# Patient Record
Sex: Male | Born: 1965 | Race: Black or African American | Hispanic: No | Marital: Married | Smoking: Former smoker
Health system: Southern US, Community
[De-identification: ages and names within clinical notes are randomized; demographics above are authoritative.]

## PROBLEM LIST (undated history)

## (undated) DIAGNOSIS — I10 Essential (primary) hypertension: Secondary | ICD-10-CM

## (undated) HISTORY — PX: ABDOMINAL SURGERY: SHX537

---

## 2015-02-07 ENCOUNTER — Encounter (HOSPITAL_COMMUNITY): Payer: Self-pay | Admitting: Cardiology

## 2015-02-07 ENCOUNTER — Emergency Department (HOSPITAL_COMMUNITY)
Admission: EM | Admit: 2015-02-07 | Discharge: 2015-02-07 | Disposition: A | Discharge status: Home / Self Care | Payer: Medicaid - Out of State | Attending: Emergency Medicine | Admitting: Emergency Medicine

## 2015-02-07 DIAGNOSIS — Z87828 Personal history of other (healed) physical injury and trauma: Secondary | ICD-10-CM | POA: Diagnosis not present

## 2015-02-07 DIAGNOSIS — M25512 Pain in left shoulder: Secondary | ICD-10-CM | POA: Diagnosis not present

## 2015-02-07 DIAGNOSIS — I10 Essential (primary) hypertension: Secondary | ICD-10-CM | POA: Diagnosis not present

## 2015-02-07 DIAGNOSIS — G8929 Other chronic pain: Secondary | ICD-10-CM | POA: Diagnosis not present

## 2015-02-07 DIAGNOSIS — Z87891 Personal history of nicotine dependence: Secondary | ICD-10-CM | POA: Insufficient documentation

## 2015-02-07 HISTORY — DX: Essential (primary) hypertension: I10

## 2015-02-07 MED ORDER — MELOXICAM 7.5 MG PO TABS: 7.5000 mg | ORAL_TABLET | Freq: Every day | ORAL | Status: AC

## 2015-02-07 NOTE — Discharge Instructions (Signed)
Read the information below.  Use the prescribed medication as directed.  Please discuss all new medications with your pharmacist.  You may return to the Emergency Department at any time for worsening condition or any new symptoms that concern you.    If you develop uncontrolled pain, weakness or numbness of the extremity, severe discoloration of the skin, or you are unable to move your shoulder, return to the ER for a recheck.      Shoulder Pain The shoulder is the joint that connects your arms to your body. The bones that form the shoulder joint include the upper arm bone (humerus), the shoulder blade (scapula), and the collarbone (clavicle). The top of the humerus is shaped like a ball and fits into a rather flat socket on the scapula (glenoid cavity). A combination of muscles and strong, fibrous tissues that connect muscles to bones (tendons) support your shoulder joint and hold the ball in the socket. Small, fluid-filled sacs (bursae) are located in different areas of the joint. They act as cushions between the bones and the overlying soft tissues and help reduce friction between the gliding tendons and the bone as you move your arm. Your shoulder joint allows a wide range of motion in your arm. This range of motion allows you to do things like scratch your back or throw a ball. However, this range of motion also makes your shoulder more prone to pain from overuse and injury. Causes of shoulder pain can originate from both injury and overuse and usually can be grouped in the following four categories:  Redness, swelling, and pain (inflammation) of the tendon (tendinitis) or the bursae (bursitis).  Instability, such as a dislocation of the joint.  Inflammation of the joint (arthritis).  Broken bone (fracture). HOME CARE INSTRUCTIONS   Apply ice to the sore area.  Put ice in a plastic bag.  Place a towel between your skin and the bag.  Leave the ice on for 15-20 minutes, 3-4 times per day for  the first 2 days, or as directed by your health care provider.  Stop using cold packs if they do not help with the pain.  If you have a shoulder sling or immobilizer, wear it as long as your caregiver instructs. Only remove it to shower or bathe. Move your arm as little as possible, but keep your hand moving to prevent swelling.  Squeeze a soft ball or foam pad as much as possible to help prevent swelling.  Only take over-the-counter or prescription medicines for pain, discomfort, or fever as directed by your caregiver. SEEK MEDICAL CARE IF:   Your shoulder pain increases, or new pain develops in your arm, hand, or fingers.  Your hand or fingers become cold and numb.  Your pain is not relieved with medicines. SEEK IMMEDIATE MEDICAL CARE IF:   Your arm, hand, or fingers are numb or tingling.  Your arm, hand, or fingers are significantly swollen or turn white or blue. MAKE SURE YOU:   Understand these instructions.  Will watch your condition.  Will get help right away if you are not doing well or get worse. Document Released: 03/18/2005 Document Revised: 10/23/2013 Document Reviewed: 05/23/2011 [facility] Patient Information 2015 [place], [place]. This information is not intended to replace advice given to you by your health care provider. Make sure you discuss any questions you have with your health care provider.  Shoulder Range of Motion Exercises The shoulder is the most flexible joint in the human body. Because of this  it is also the most unstable joint in the body. All ages can develop shoulder problems. Early treatment of problems is necessary for a good outcome. People react to shoulder pain by decreasing the movement of the joint. After a brief period of time, the shoulder can become "frozen". This is an almost complete loss of the ability to move the damaged shoulder. Following injuries your caregivers can give you instructions on exercises to keep your range of motion (ability  to move your shoulder freely), or regain it if it has been lost.  EXERCISES EXERCISES TO MAINTAIN THE MOBILITY OF YOUR SHOULDER: Codman's Exercise or Pendulum Exercise  This exercise may be performed in a prone (face-down) lying position or standing while leaning on a chair with the opposite arm. Its purpose is to relax the muscles in your shoulder and slowly but surely increase the range of motion and to relieve pain.  Lie on your stomach close to the side edge of the bed. Let your weak arm hang over the edge of the bed. Relax your shoulder, arm and hand. Let your shoulder blade relax and drop down.  Slowly and gently swing your arm forward and back. Do not use your neck muscles; relax them. It might be easier to have someone else gently start swinging your arm.  As pain decreases, increase your swing. To start, arm swing should begin at 15 degree angles. In time and as pain lessens, move to 30-45 degree angles. Start with swinging for about 15 seconds, and work towards swinging for 3 to 5 minutes.  This exercise may also be performed in a standing/bent over position.  Stand and hold onto a sturdy chair with your good arm. Bend forward at the waist and bend your knees slightly to help protect your back. Relax your weak arm, let it hang limp. Relax your shoulder blade and let it drop.  Keep your shoulder relaxed and use body motion to swing your arm in small circles.  Stand up tall and relax.  Repeat motion and change direction of circles.  Start with swinging for about 30 seconds, and work towards swinging for 3 to 5 minutes. STRETCHING EXERCISES:  Lift your arm out in front of you with the elbow bent at 90 degrees. Using your other arm gently pull the elbow forward and across your body.  Bend one arm behind you with the palm facing outward. Using the other arm, hold a towel or rope and reach this arm up above your head, then bend it at the elbow to move your wrist to behind your neck.  Grab the free end of the towel with the hand behind your back. Gently pull the towel up with the hand behind your neck, gradually increasing the pull on the hand behind the small of your back. Then, gradually pull down with the hand behind the small of your back. This will pull the hand and arm behind your neck further. Both shoulders will have an increased range of motion with repetition of this exercise. STRENGTHENING EXERCISES:  Standing with your arm at your side and straight out from your shoulder with the elbow bent at 90 degrees, hold onto a small weight and slowly raise your hand so it points straight up in the air. Repeat this five times to begin with, and gradually increase to ten times. Do this four times per day. As you grow stronger you can gradually increase the weight.  Repeat the above exercise, only this time using an elastic  band. Start with your hand up in the air and pull down until your hand is by your side. As you grow stronger, gradually increase the amount you pull by increasing the number or size of the elastic bands. Use the same amount of repetitions.  Standing with your hand at your side and holding onto a weight, gradually lift the hand in front of you until it is over your head. Do the same also with the hand remaining at your side and lift the hand away from your body until it is again over your head. Repeat this five times to begin with, and gradually increase to ten times. Do this four times per day. As you grow stronger you can gradually increase the weight. Document Released: 03/07/2003 Document Revised: 06/13/2013 Document Reviewed: 06/08/2005 [facility] Patient Information 2015 [place], [place]. This information is not intended to replace advice given to you by your health care provider. Make sure you discuss any questions you have with your health care provider.    Emergency Department Resource Guide 1) Find a Doctor and Pay Out of Pocket Although you won't have to  find out who is covered by your insurance plan, it is a good idea to ask around and get recommendations. You will then need to call the office and see if the doctor you have chosen will accept you as a new patient and what types of options they offer for patients who are self-pay. Some doctors offer discounts or will set up payment plans for their patients who do not have insurance, but you will need to ask so you aren't surprised when you get to your appointment.  2) Contact Your Local Health Department Not all health departments have doctors that can see patients for sick visits, but many do, so it is worth a call to see if yours does. If you don't know where your local health department is, you can check in your phone book. The CDC also has a tool to help you locate your state's health department, and many state websites also have listings of all of their local health departments.  3) Find a Walk-in Clinic If your illness is not likely to be very severe or complicated, you may want to try a walk in clinic. These are popping up all over the country in pharmacies, drugstores, and shopping centers. They're usually staffed by nurse practitioners or physician assistants that have been trained to treat common illnesses and complaints. They're usually fairly quick and inexpensive. However, if you have serious medical issues or chronic medical problems, these are probably not your best option.  No Primary Care Doctor: - Call Health Connect at  [phone] - they can help you locate a primary care doctor that  accepts your insurance, provides certain services, etc. - Physician Referral Service- [phone]  Chronic Pain Problems: Organization         Address  Phone   Notes  [facility]  [phone] Patients need to be referred by their primary care doctor.   Medication Assistance: Organization         Address  Phone   Notes  [facility] [address] [place], [place] [address] [phone] --Must be a resident of [facility] -- Must have NO insurance coverage whatsoever (no Medicaid/ Medicare, etc.) -- The pt. MUST have a primary care doctor that directs their care regularly and follows them in the community   [facility]  [phone]  [employer]  [phone]    Agencies that provide inexpensive medical care: Organization         Address  Phone   Notes  [facility]  [phone]   [facility]    [phone]   [facility] [address] [place], [place] [address] [phone]   [facility] [address], [place] [phone]   [facility]anned Parenthood    [phone]   [facility]    [phone]   [facility]  [address], [place] Phone:  [phone], Fax:  [phone] Hours of Operation:  9 am - 6 pm, M-F.  Also accepts Medicaid/Medicare and self-pay.  [facility]  [address], [place] Phone: [phone], Fax: [phone]. Hours of Operation:  8:30 am - 5:30 pm, M-F.  Also accepts Medicaid and self-pay.  [facility] [place] [address] [place] Phone: [phone]   [facility] [address] Natasha Bence [place], [place] [phone], Ext. [phone] Mondays & Thursdays: 7-9 AM.  First 15 patients are seen on a first come, first serve basis.    Medicaid-accepting [facility] Providers:  Organization         Address  Phone   Notes  [facility] [address],  [phone] Also accepts self-pay patients.  [facility] [address] Laurell Josephs [place], [place]  [phone]   [facility] [address], [place] [phone]   [facility] [address] [place] [phone]   Renaye Rakers [address], [place]   [phone] Only accepts [place] Access [place] patients after they have their name applied to their card.   Self-Pay (no insurance) in [facility]:  Organization         Address  Phone   Notes  Sickle Cell Patients, [facility] [address] [place], [place] [phone]   [facility] [address] [place], [place] [phone]   [facility] [place]  [address], [place] [phone]   [facility]/Dr. Osei-Bonsu  [address] [place] or [address], [place] [phone] Phone number for both [place] and [place] locations is the same.  Urgent Medical and [facility] [address], [place] [phone]   [facility] [address] [place] or [address] [phone]   [facility] [address] [place] [phone], phone; [phone], fax Sees patients 1st and 3rd Saturday of every month.  Must not qualify for public or private insurance (i.e. Medicaid, Medicare, [place] Health Choice, Veterans' Benefits)  Household income should be no more than 200% of the poverty level The clinic cannot treat you if you are pregnant or think you are pregnant  Sexually transmitted diseases are not treated at the clinic.    Dental Care: Organization         Address  Phone  Notes  [facility] Department of [facility] [address] [place], [place] [phone] Accepts children up to age 54 who are enrolled in [place] or [place] Health Choice; pregnant women with a Medicaid card; and  children who have applied for Medicaid or [place] Health Choice, but were declined, whose parents can pay a reduced fee at time of service.  [facility]  [address], [place] [phone] Accepts children up to age  20 who are enrolled in [place] or [place] Health Choice; pregnant women with a Medicaid card; and children who have applied for Medicaid or [place] Health Choice, but were declined, whose parents can pay a reduced fee at time of service.  [facility]  [address] [place], [place] [phone] Patients are seen by appointment only. Walk-ins are not accepted. [facility] will see patients 60 years of age and older. Monday - Tuesday (8am-5pm) Most Wednesdays (8:30-5pm) $30 per visit, cash only  [facility] Adult [facility] Access [facility]  [address], [facility] [phone] Patients are seen by appointment only. Walk-ins are not accepted. [facility] will see patients 28 years of age and older. One Wednesday Evening (Monthly: Volunteer Based).  $30 per visit, cash only  [employer]  [phone] for adults; Children under age 5, call Graduate Pediatric Dentistry at [phone]. Children aged 21-14, please call [phone] to request a pediatric application.  Dental services are provided in all areas of dental care including fillings, crowns and bridges, complete and partial dentures, implants, gum treatment, root canals, and extractions. Preventive care is also provided. Treatment is provided to both adults and children. Patients are selected via a lottery and there is often a waiting list.   [facility] [address] [place]  [phone] [email or web address]   [facility] [address] [place], [place] [phone], Ext. [phone] Second and Fourth Thursday of each month, opens at 6:30 AM; Clinic ends at 9 AM.  Patients are seen on a first-come first-served basis, and a limited number are seen during each clinic.   [facility]  [address] Ether Griffins [place], [place] [phone]   Eligibility Requirements You must have lived in [place], [place], or [place] counties for at least the last three  months.   You cannot be eligible for state or federal sponsored [place], including [employer], [place], or [employer].   You generally cannot be eligible for healthcare insurance through your employer.    How to apply: Eligibility screenings are held every Tuesday and Wednesday afternoon from 1:00 pm until 4:00 pm. You do not need an appointment for the interview!  [facility] [address] [place], [place] [phone]   [facility]  [phone]   [facility]  [phone]   [facility] Health Department  [phone]    Behavioral Health Resources in the Community: Intensive Outpatient Programs Organization         Address  Phone  Notes  [facility] [address], [place], [place] [phone]   [facility] [address] [place], [place] [phone]   [facility] [address] [place], [place]  [phone]   [facility] Mental Health [address]  [place], [place] [phone] or [phone]   Substance Abuse Resources Organization         Address  Phone  Notes  [facility]  [phone]   [facility]  [phone]   [facility] [place]  [phone]   [facility]  [phone]   Residential &  Outpatient Substance Abuse Program  [phone]   Psychological Services Organization         Address  Phone  Notes  [facility]  [phone]   [facility]  [phone]   [facility] [phone] [address] [place] [phone] or [phone]    Mobile Crisis Teams Organization         Address  Phone  Notes  [facility], Mobile Crisis Care Unit  [phone]   [facility]  [address] [place], [place] [phone]   Doristine Locks [address] [place] [phone]    Self-Help/Support  Groups Organization         Address  Phone             Notes  [facility] [place] - variety of support groups  336- I7437963 Call for more information  Narcotics Anonymous (NA), [facility] [address], [employer] [place]  2 meetings at this location   Statistician         Address  Phone  Notes  [facility] [address],    [place]  [phone]   [facility]  [address], [place] [address], [place], [place] [phone]   [facility] [address] [place], [place] [phone] Admissions: 8am-3pm M-F  [facility] [address],    [place], [place] [phone]   [facility] [address] [place], [place], [place] [phone]   [facility] [address],  [place], [place] [phone]   [facility] - Intensive Outpatient [address] Laurell Josephs [address], [place], [place] [phone]   [facility] (Addiction [facility] [address]  [place], [place] [phone] or [phone]   [facility] ([facility]) [address] [place], [place] [phone] Accepts Medicaid  [facility] [address]  [place] [phone] Substance Abuse/Addiction Treatment   [facility] Organization         Address  Phone  Notes  [facility]  [phone]   Angie Fava, PhD [address] Ervin Knack [place], [place]   [phone] or [phone]   [facility]   [address] [place], [place] [phone]   [facility] [address] [place], [place] [phone] Insurance/Medicaid/sponsorship through [facility] and Families [address]                                    [place], [place] [phone] Therapy/tele-psych/case  [facility] [address], [place] [phone]    Dr. Lolly Mustache  [phone]   [facility] [place]  [employer] [facility] Dept. 1) [address] [place] 2) [address] [place] 3)  [address], [place] [phone]  [phone]   [facility] Child Abuse Hotline (408)722-6225 or 6234240069 (After Hours)

## 2015-02-07 NOTE — ED Provider Notes (Signed)
CSN: [id number]     Arrival date & [id number]  1154 History  This chart was scribed for non-physician practitioner Trixie Dredge, PA-C, working with Raeford Razor, MD by Littie Deeds, ED Scribe. This patient was seen in room TR09C/TR09C and the patient's care was started at 12:36 PM.      Chief Complaint  Patient presents with  . Shoulder Pain   The history is provided by the patient. No language interpreter was used.   HPI Comments: Brent Green is a 49 y.o. male who presents to the Emergency Department complaining of chronic, stabbing left shoulder pain that worsened recently. The pain is worse with movement. It has been ongoing for years and is unchanged.  No new injury.  Patient denies arm numbness or weakness, back pain, chest pain, and neck pain. He also denies new injury. 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 He was told years ago that he had cracked something in his left shoulder which welded back together. Patient does not have a PCP here as he recently moved to the area and wants a referral.   Pt previously took percocet for pain.   Past Medical History  Diagnosis Date  . Hypertension    History reviewed. No pertinent past surgical history. History reviewed. No pertinent family history. Social History  Substance Use Topics  . Smoking status: Former Games developer  . Smokeless tobacco: None  . Alcohol Use: No    Review of Systems  Constitutional: Negative for fever and chills.  Respiratory: Negative for shortness of breath.   Cardiovascular: Negative for chest pain.  Musculoskeletal: Positive for arthralgias. Negative for back pain and neck pain.  Skin: Negative for color change.  Allergic/Immunologic: Negative for immunocompromised state.  Neurological: Negative for weakness and numbness.  Psychiatric/Behavioral: Negative for self-injury.      Allergies  Review of patient's allergies indicates no known allergies.  Home Medications   Prior to Admission medications    Not on File   BP 126/74 mmHg  Pulse 79  Temp(Src) 98.1 F (36.7 C) (Oral)  Resp 16  SpO2 99% Physical Exam  Constitutional: He appears well-developed and well-nourished. No distress.  HENT:  Head: Normocephalic and atraumatic.  Neck: Neck supple.  Pulmonary/Chest: Effort normal.  Musculoskeletal:  Upper extremities:  Strength 5/5, sensation intact, distal pulses intact. Left shoulder with tenderness anteriorly. Full active ROM with pain attempting to move arm posteriorly.  No erythema, edema, warmth, discharge.   Neurological: He is alert.  Skin: He is not diaphoretic.  Nursing note and vitals reviewed.   ED Course  Procedures  DIAGNOSTIC STUDIES: Oxygen Saturation is 99% on room air, normal by my interpretation.    COORDINATION OF CARE: 12:41 PM-Discussed treatment plan which includes medications and orthopedic referral with patient/guardian at bedside and patient/guardian agreed to plan.    Labs Review Labs Reviewed - No data to display  Imaging Review No results found. I have personally reviewed and evaluated these images and lab results as part of my medical decision-making.   EKG Interpretation None      MDM   Final diagnoses:  Chronic left shoulder pain    Afebrile, nontoxic patient with chronic left shoulder pain x many years, unchanged.  No new injury.  Neurovascularly intact.  Of note, pt and has spouse came to the ED together, both complaining of chronic pain and both requesting percocet.   D/C home with mobic, orthopedic referral.  Discussed result, findings, treatment, and follow up  with  patient.  Pt given return precautions.  Pt verbalizes understanding and agrees with plan.      I personally performed the services described in this documentation, which was scribed in my presence. The recorded information has been reviewed and is accurate.    Trixie Dredge, PA-C 02/07/15 1423  Raeford Razor, MD 02/08/15 [phone]

## 2015-02-07 NOTE — ED Notes (Signed)
Pt reports that he has an old injury of his left shoulder. Reports the pain has recently increased and he is out of medication.

## 2015-03-06 ENCOUNTER — Encounter (HOSPITAL_COMMUNITY): Payer: Self-pay | Admitting: Emergency Medicine

## 2015-03-06 ENCOUNTER — Emergency Department (HOSPITAL_COMMUNITY)
Admission: EM | Admit: 2015-03-06 | Discharge: 2015-03-06 | Disposition: A | Discharge status: Home / Self Care | Payer: Medicaid - Out of State | Attending: Emergency Medicine | Admitting: Emergency Medicine

## 2015-03-06 ENCOUNTER — Emergency Department (HOSPITAL_COMMUNITY): Payer: Medicaid - Out of State

## 2015-03-06 DIAGNOSIS — Z87891 Personal history of nicotine dependence: Secondary | ICD-10-CM | POA: Insufficient documentation

## 2015-03-06 DIAGNOSIS — I10 Essential (primary) hypertension: Secondary | ICD-10-CM | POA: Diagnosis not present

## 2015-03-06 DIAGNOSIS — R945 Abnormal results of liver function studies: Secondary | ICD-10-CM

## 2015-03-06 DIAGNOSIS — Z791 Long term (current) use of non-steroidal anti-inflammatories (NSAID): Secondary | ICD-10-CM | POA: Diagnosis not present

## 2015-03-06 DIAGNOSIS — R509 Fever, unspecified: Secondary | ICD-10-CM | POA: Diagnosis present

## 2015-03-06 DIAGNOSIS — J159 Unspecified bacterial pneumonia: Secondary | ICD-10-CM | POA: Diagnosis not present

## 2015-03-06 DIAGNOSIS — R7989 Other specified abnormal findings of blood chemistry: Secondary | ICD-10-CM | POA: Insufficient documentation

## 2015-03-06 DIAGNOSIS — R51 Headache: Secondary | ICD-10-CM | POA: Insufficient documentation

## 2015-03-06 DIAGNOSIS — J189 Pneumonia, unspecified organism: Secondary | ICD-10-CM

## 2015-03-06 LAB — URINALYSIS, ROUTINE W REFLEX MICROSCOPIC
Glucose, UA: NEGATIVE mg/dL
Hgb urine dipstick: NEGATIVE
Ketones, ur: NEGATIVE mg/dL
LEUKOCYTES UA: NEGATIVE
NITRITE: NEGATIVE
PH: 7 (ref 5.0–8.0)
Protein, ur: NEGATIVE mg/dL
SPECIFIC GRAVITY, URINE: 1.015 (ref 1.005–1.030)
UROBILINOGEN UA: 4 mg/dL — AB (ref 0.0–1.0)

## 2015-03-06 LAB — COMPREHENSIVE METABOLIC PANEL
ALBUMIN: 3.5 g/dL (ref 3.5–5.0)
ALK PHOS: 134 U/L — AB (ref 38–126)
ALT: 100 U/L — AB (ref 17–63)
AST: 59 U/L — ABNORMAL HIGH (ref 15–41)
Anion gap: 8 (ref 5–15)
BILIRUBIN TOTAL: 3.3 mg/dL — AB (ref 0.3–1.2)
BUN: 6 mg/dL (ref 6–20)
CALCIUM: 9.2 mg/dL (ref 8.9–10.3)
CO2: 25 mmol/L (ref 22–32)
CREATININE: 0.93 mg/dL (ref 0.61–1.24)
Chloride: 100 mmol/L — ABNORMAL LOW (ref 101–111)
GFR calc Af Amer: >60 mL/min (ref >60)
GFR calc non Af Amer: >60 mL/min (ref >60)
GLUCOSE: 102 mg/dL — AB (ref 65–99)
Potassium: 3.8 mmol/L (ref 3.5–5.1)
SODIUM: 133 mmol/L — AB (ref 135–145)
TOTAL PROTEIN: 8.2 g/dL — AB (ref 6.5–8.1)

## 2015-03-06 LAB — CBC WITH DIFFERENTIAL/PLATELET
BASOS ABS: 0 10*3/uL (ref 0.0–0.1)
Basophils Relative: 0 %
EOS PCT: 0 %
Eosinophils Absolute: 0.1 10*3/uL (ref 0.0–0.7)
HCT: 44 % (ref 39.0–52.0)
HEMOGLOBIN: 15.9 g/dL (ref 13.0–17.0)
LYMPHS ABS: 2.4 10*3/uL (ref 0.7–4.0)
LYMPHS PCT: 18 %
MCH: 34.4 pg — ABNORMAL HIGH (ref 26.0–34.0)
MCHC: 36.1 g/dL — ABNORMAL HIGH (ref 30.0–36.0)
MCV: 95.2 fL (ref 78.0–100.0)
Monocytes Absolute: 1.3 10*3/uL — ABNORMAL HIGH (ref 0.1–1.0)
Monocytes Relative: 10 %
NEUTROS ABS: 9.5 10*3/uL — AB (ref 1.7–7.7)
NEUTROS PCT: 71 %
PLATELETS: 125 10*3/uL — AB (ref 150–400)
RBC: 4.62 MIL/uL (ref 4.22–5.81)
RDW: 12.8 % (ref 11.5–15.5)
WBC: 13.3 10*3/uL — AB (ref 4.0–10.5)

## 2015-03-06 LAB — I-STAT CG4 LACTIC ACID, ED
LACTIC ACID, VENOUS: 1.6 mmol/L (ref 0.5–2.0)
Lactic Acid, Venous: 1.25 mmol/L (ref 0.5–2.0)

## 2015-03-06 MED ORDER — SODIUM CHLORIDE 0.9 % IV BOLUS (SEPSIS)
1000.0000 mL | Freq: Once | INTRAVENOUS | Status: AC
  Administered 2015-03-06: 1000 mL via INTRAVENOUS

## 2015-03-06 MED ORDER — ACETAMINOPHEN 325 MG PO TABS
ORAL_TABLET | ORAL | Status: AC
  Filled 2015-03-06: qty 2

## 2015-03-06 MED ORDER — AZITHROMYCIN 250 MG PO TABS
500.0000 mg | ORAL_TABLET | Freq: Once | ORAL | Status: AC
  Administered 2015-03-06: 500 mg via ORAL
  Filled 2015-03-06: qty 2

## 2015-03-06 MED ORDER — ACETAMINOPHEN 325 MG PO TABS
650.0000 mg | ORAL_TABLET | Freq: Once | ORAL | Status: AC | PRN
  Administered 2015-03-06: 650 mg via ORAL

## 2015-03-06 MED ORDER — KETOROLAC TROMETHAMINE 30 MG/ML IJ SOLN
60.0000 mg | Freq: Once | INTRAMUSCULAR | Status: DC
  Filled 2015-03-06: qty 2

## 2015-03-06 MED ORDER — AZITHROMYCIN 250 MG PO TABS: 250.0000 mg | ORAL_TABLET | Freq: Every day | ORAL | Status: AC

## 2015-03-06 MED ORDER — KETOROLAC TROMETHAMINE 30 MG/ML IJ SOLN
30.0000 mg | Freq: Once | INTRAMUSCULAR | Status: AC
  Administered 2015-03-06: 30 mg via INTRAVENOUS

## 2015-03-06 NOTE — Discharge Instructions (Signed)
You need to have a repeat chest x-ray and liver enzymes in 2-3 weeks Pneumonia, Adult Pneumonia is an infection of the lungs. It may be caused by a germ (virus or bacteria). Some types of pneumonia can spread easily from person to person. This can happen when you cough or sneeze. HOME CARE  Only take medicine as told by your doctor.  Take your medicine (antibiotics) as told. Finish it even if you start to feel better.  Do not smoke.  You may use a vaporizer or humidifier in your room. This can help loosen thick spit (mucus).  Sleep so you are almost sitting up (semi-upright). This helps reduce coughing.  Rest. A shot (vaccine) can help prevent pneumonia. Shots are often advised for:  People over 75 years old.  Patients on chemotherapy.  People with long-term (chronic) lung problems.  People with immune system problems. GET HELP RIGHT AWAY IF:   You are getting worse.  You cannot control your cough, and you are losing sleep.  You cough up blood.  Your pain gets worse, even with medicine.  You have a fever.  Any of your problems are getting worse, not better.  You have shortness of breath or chest pain. MAKE SURE YOU:   Understand these instructions.  Will watch your condition.  Will get help right away if you are not doing well or get worse. Document Released: 11/25/2007 Document Revised: 08/31/2011 Document Reviewed: 08/29/2010 [facility] Patient Information 2015 [place], [place]. This information is not intended to replace advice given to you by your health care provider. Make sure you discuss any questions you have with your health care provider.

## 2015-03-06 NOTE — ED Notes (Signed)
Pt states was bit by a mosquito on Sunday, now has chills, fever, headache, states "my skull hurts." Tylenol given at 0330 at home.

## 2015-03-06 NOTE — ED Provider Notes (Signed)
CSN: [id number]     Arrival date & time 03/06/15  1156 History   First MD Initiated Contact with Patient 03/06/15 1317     Chief Complaint  Patient presents with  . Fever  . Chills  . Insect Bite     (Consider location/radiation/quality/duration/timing/severity/associated sxs/prior Treatment) HPI Comments: Pt comes in with c/o fever, chills,cough and body aches that started 2 days ago. He has tylenol with intermittent relief. No vomiting. He states that he was bitten by a mosquito and he thinks that it is related. He does have a headache, denies sore throat or abdominal pain.  The history is provided by the patient. No language interpreter was used.    Past Medical History  Diagnosis Date  . Hypertension    Past Surgical History  Procedure Laterality Date  . Abdominal surgery     No family history on file. Social History  Substance Use Topics  . Smoking status: Former Games developer  . Smokeless tobacco: None  . Alcohol Use: No    Review of Systems  All other systems reviewed and are negative.     Allergies  Review of patient's allergies indicates no known allergies.  Home Medications   Prior to Admission medications   Medication Sig Start Date End Date Taking? Authorizing Provider  meloxicam (MOBIC) 7.5 MG tablet Take 1 tablet (7.5 mg total) by mouth daily. 02/07/15   Trixie Dredge, PA-C   BP 142/75 mmHg  Pulse 105  Temp(Src) 101.9 F (38.8 C) (Oral)  Ht  (1.803 m)  Wt 205 lb (92.987 kg)  BMI 28.60 kg/m2  SpO2 100% Physical Exam  Constitutional: He is oriented to person, place, and time. He appears well-developed and well-nourished.  HENT:  Right Ear: External ear normal.  Left Ear: External ear normal.  Mouth/Throat: No oropharyngeal exudate, posterior oropharyngeal edema or posterior oropharyngeal erythema.  Eyes: Conjunctivae and EOM are normal. Pupils are equal, round, and reactive to light.  Neck: Normal range of motion. Neck supple.  No meningismus    Cardiovascular: Normal rate and regular rhythm.   Pulmonary/Chest: Effort normal and breath sounds normal.  Abdominal: Soft. Bowel sounds are normal. There is no tenderness.  Musculoskeletal: Normal range of motion.  Neurological: He is alert and oriented to person, place, and time.  Skin: Skin is warm and dry.  Psychiatric: He has a normal mood and affect.  Nursing note and vitals reviewed.   ED Course  Procedures (including critical care time) Labs Review Labs Reviewed  COMPREHENSIVE METABOLIC PANEL - Abnormal; Notable for the following:    Sodium 133 (*)    Chloride 100 (*)    Glucose, Bld 102 (*)    Total Protein 8.2 (*)    AST 59 (*)    ALT 100 (*)    Alkaline Phosphatase 134 (*)    Total Bilirubin 3.3 (*)    All other components within normal limits  CBC WITH DIFFERENTIAL/PLATELET - Abnormal; Notable for the following:    WBC 13.3 (*)    MCH 34.4 (*)    MCHC 36.1 (*)    Platelets 125 (*)    Neutro Abs 9.5 (*)    Monocytes Absolute 1.3 (*)    All other components within normal limits  CULTURE, BLOOD (ROUTINE X 2)  CULTURE, BLOOD (ROUTINE X 2)  URINE CULTURE  URINALYSIS, ROUTINE W REFLEX MICROSCOPIC (NOT AT [facility])  I-STAT CG4 LACTIC ACID, ED    Imaging Review Dg Chest 2 View  03/06/2015  CLINICAL DATA:  Fever after bitten by mosquito 2 days prior  EXAM: CHEST  2 VIEW  COMPARISON:  None.  FINDINGS: There is consolidation in the right lower lobe medially and posteriorly. Lungs elsewhere clear. Heart size and pulmonary vascularity are normal. No adenopathy. No bone lesions.  IMPRESSION: Right lower lobe consolidation. Lungs elsewhere clear. Followup PA and lateral chest radiographs recommended in 3-4 weeks following trial of antibiotic therapy to ensure resolution and exclude underlying malignancy.   Electronically Signed   By: Bretta Bang III M.D.   On: 03/06/2015 13:08   I have personally reviewed and evaluated these images and lab results as part of my medical  decision-making.   EKG Interpretation None      MDM   Final diagnoses:  CAP (community acquired pneumonia)  Elevated LFTs   Will send home with treatment for pneumonia. Discussed need for repeat chest x-ray. Pt is no hypoxic or tachycardic. Will send home on zithromax    Teressa Lower, NP 03/06/15 1551  Laurence Spates, MD 03/06/15 [phone]

## 2015-03-08 LAB — URINE CULTURE

## 2015-03-11 LAB — CULTURE, BLOOD (ROUTINE X 2)
CULTURE: NO GROWTH
Culture: NO GROWTH

## 2016-08-28 IMAGING — DX DG CHEST 2V
2 series · 2 of 2 positions shown · non-contrast
Comparison: None.

CLINICAL DATA: Fever after bitten by mosquito 2 days prior

EXAM:
CHEST  2 VIEW

[w chest pa]
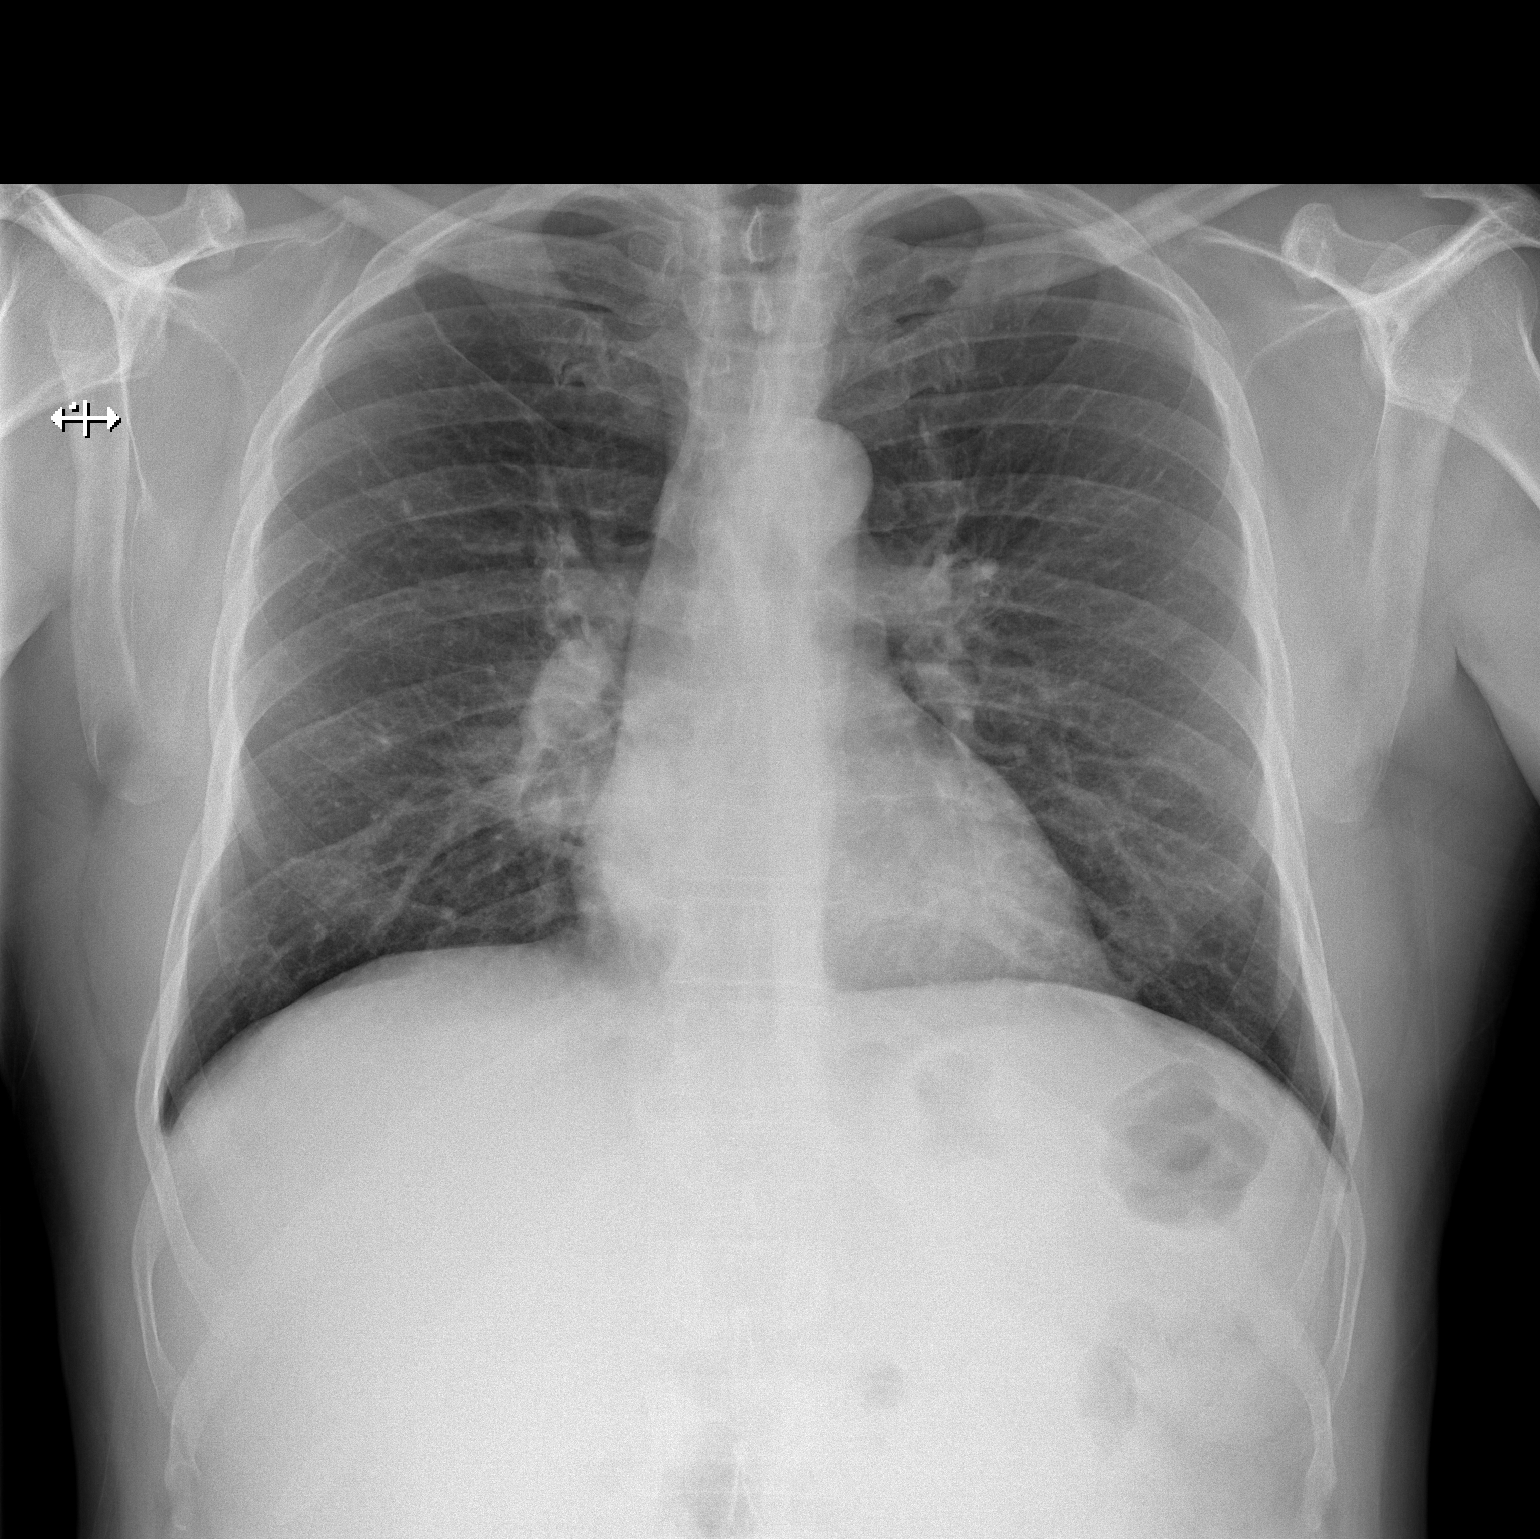

[w chest lat]
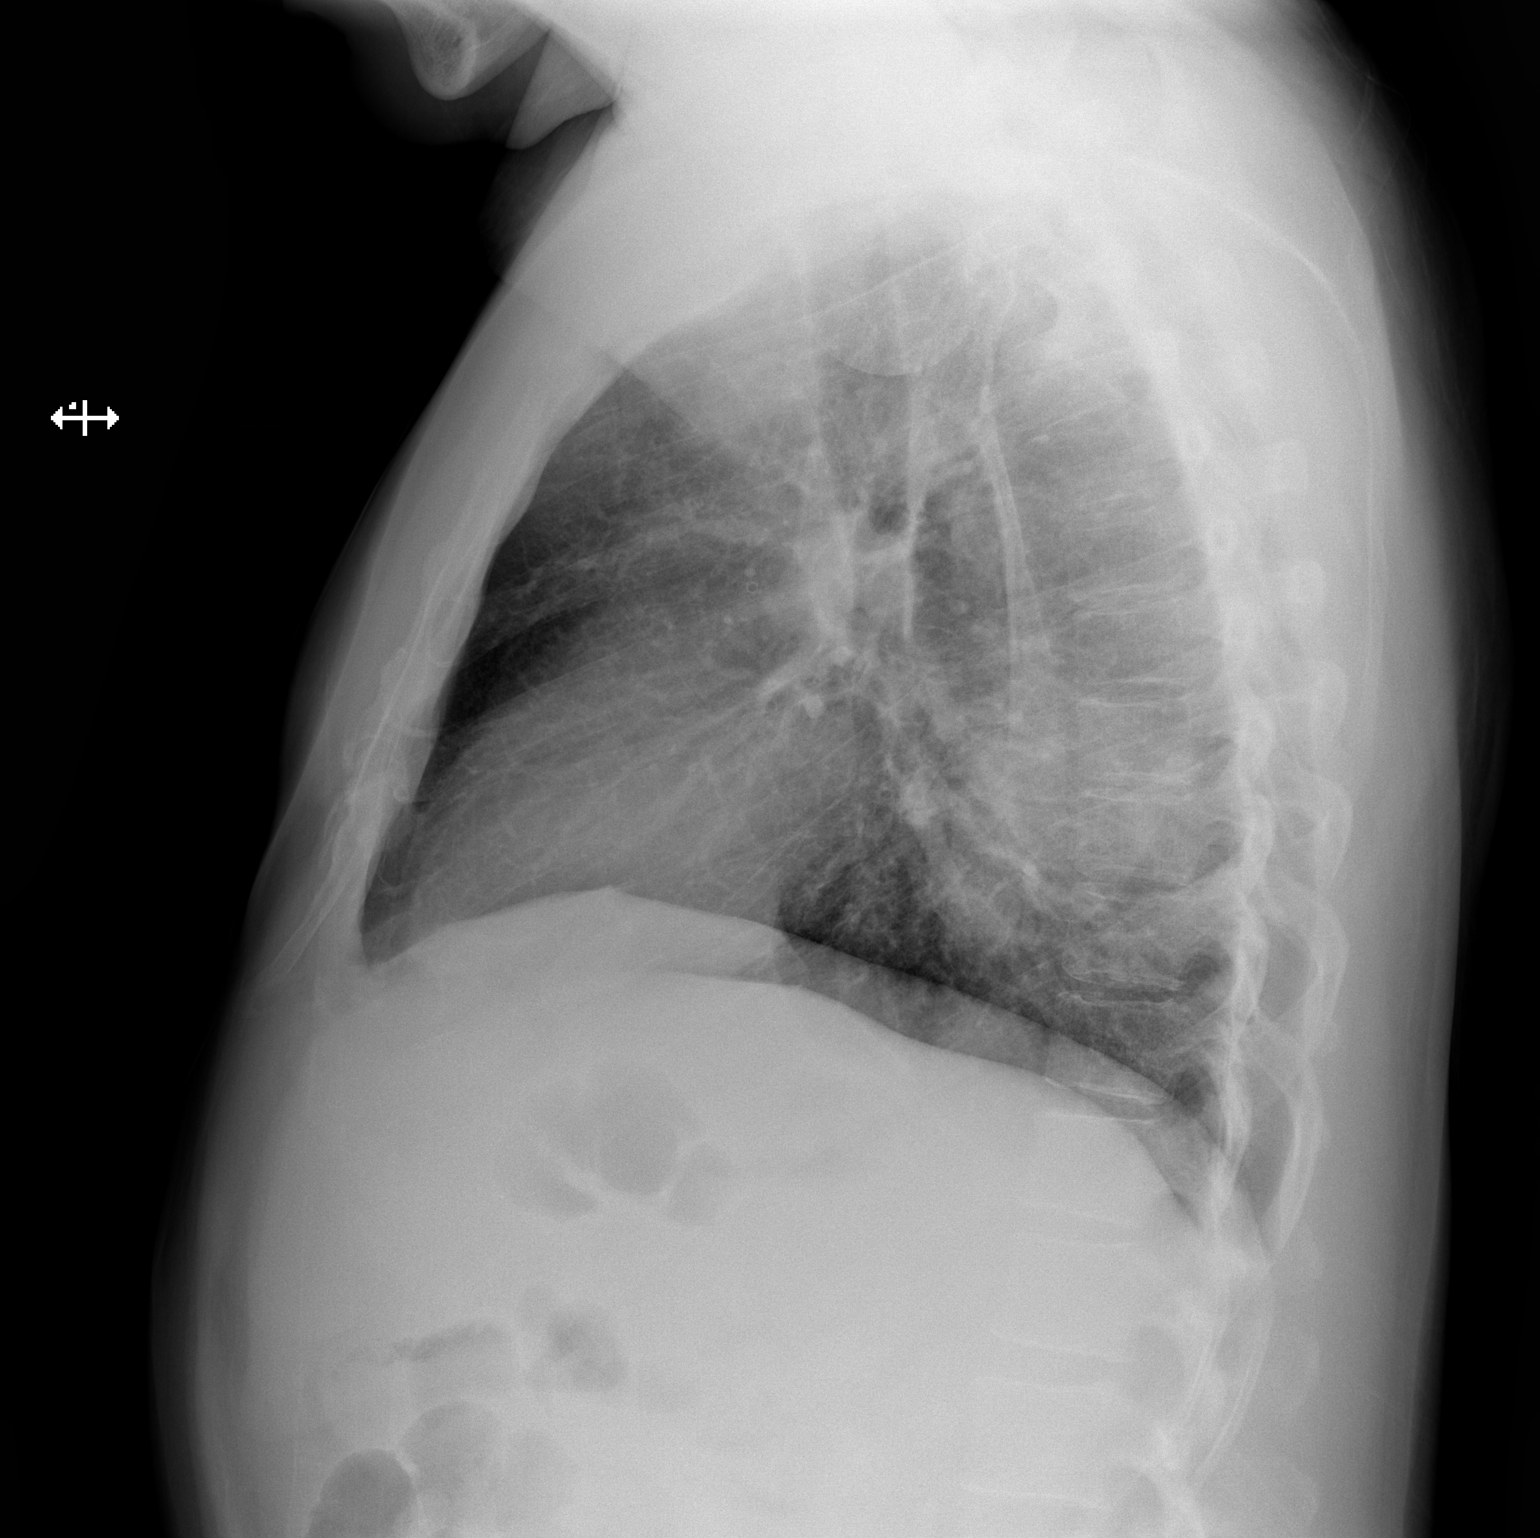

[2 of 2 positions shown; findings below may reference images not displayed]

FINDINGS: There is consolidation in the right lower lobe medially and
posteriorly. Lungs elsewhere clear. Heart size and pulmonary
vascularity are normal. No adenopathy. No bone lesions.
IMPRESSION: Right lower lobe consolidation. Lungs elsewhere clear. Followup PA
and lateral chest radiographs recommended in 3-4 weeks following
trial of antibiotic therapy to ensure resolution and exclude
underlying malignancy.

## 2020-03-22 DEATH — deceased
